# Patient Record
Sex: Male | Born: 1950 | Race: White | Hispanic: No | Marital: Married | Smoking: Current every day smoker
Health system: Southern US, Community
[De-identification: ages and names within clinical notes are randomized; demographics above are authoritative.]

## PROBLEM LIST (undated history)

## (undated) DIAGNOSIS — B1920 Unspecified viral hepatitis C without hepatic coma: Secondary | ICD-10-CM

## (undated) DIAGNOSIS — I10 Essential (primary) hypertension: Secondary | ICD-10-CM

## (undated) DIAGNOSIS — S0285XA Fracture of orbit, unspecified, initial encounter for closed fracture: Secondary | ICD-10-CM

## (undated) DIAGNOSIS — I639 Cerebral infarction, unspecified: Secondary | ICD-10-CM

## (undated) DIAGNOSIS — K635 Polyp of colon: Secondary | ICD-10-CM

## (undated) DIAGNOSIS — F419 Anxiety disorder, unspecified: Secondary | ICD-10-CM

## (undated) DIAGNOSIS — Z87828 Personal history of other (healed) physical injury and trauma: Secondary | ICD-10-CM

## (undated) DIAGNOSIS — K449 Diaphragmatic hernia without obstruction or gangrene: Secondary | ICD-10-CM

## (undated) DIAGNOSIS — H539 Unspecified visual disturbance: Secondary | ICD-10-CM

## (undated) DIAGNOSIS — R413 Other amnesia: Secondary | ICD-10-CM

## (undated) DIAGNOSIS — F329 Major depressive disorder, single episode, unspecified: Secondary | ICD-10-CM

## (undated) HISTORY — DX: Anxiety disorder, unspecified: F41.9

## (undated) HISTORY — DX: Unspecified viral hepatitis C without hepatic coma: B19.20

## (undated) HISTORY — DX: Personal history of other (healed) physical injury and trauma: Z87.828

## (undated) HISTORY — PX: COLONOSCOPY: SHX174

## (undated) HISTORY — DX: Essential (primary) hypertension: I10

## (undated) HISTORY — DX: Fracture of orbit, unspecified, initial encounter for closed fracture: S02.85XA

## (undated) HISTORY — DX: Diaphragmatic hernia without obstruction or gangrene: K44.9

## (undated) HISTORY — DX: Major depressive disorder, single episode, unspecified: F32.9

## (undated) HISTORY — PX: BACK SURGERY: SHX140

## (undated) HISTORY — PX: FACIAL COSMETIC SURGERY: SHX629

## (undated) HISTORY — DX: Unspecified visual disturbance: H53.9

## (undated) HISTORY — DX: Other amnesia: R41.3

## (undated) HISTORY — DX: Cerebral infarction, unspecified: I63.9

## (undated) HISTORY — DX: Polyp of colon: K63.5

## (undated) HISTORY — PX: TONSILLECTOMY: SHX5217

---

## 1998-09-23 ENCOUNTER — Encounter: Payer: Self-pay | Admitting: Gastroenterology

## 1998-09-23 ENCOUNTER — Ambulatory Visit (HOSPITAL_COMMUNITY): Admission: RE | Admit: 1998-09-23 | Discharge: 1998-09-23 | Payer: Self-pay | Admitting: Gastroenterology

## 1998-10-20 ENCOUNTER — Ambulatory Visit (HOSPITAL_COMMUNITY): Admission: RE | Admit: 1998-10-20 | Discharge: 1998-10-20 | Payer: Self-pay | Admitting: Gastroenterology

## 1998-10-20 ENCOUNTER — Encounter: Payer: Self-pay | Admitting: Gastroenterology

## 2005-11-28 ENCOUNTER — Inpatient Hospital Stay (HOSPITAL_COMMUNITY): Admission: RE | Admit: 2005-11-28 | Discharge: 2005-11-29 | Payer: Self-pay | Admitting: Orthopaedic Surgery

## 2008-01-21 ENCOUNTER — Ambulatory Visit (HOSPITAL_COMMUNITY): Admission: RE | Admit: 2008-01-21 | Discharge: 2008-01-21 | Payer: Self-pay | Admitting: Interventional Cardiology

## 2008-06-19 ENCOUNTER — Emergency Department (HOSPITAL_COMMUNITY): Admission: EM | Admit: 2008-06-19 | Discharge: 2008-06-19 | Payer: Self-pay | Admitting: Emergency Medicine

## 2010-02-02 IMAGING — CT CT HEART WO/W CTA ONLY W/ CA
1 of 3 series · 13 of 20 positions shown, 17 images · IV contrast (omnipaque)
Comparison: none

Addendum Begins

CARDIAC CTA WITH CALCIUM SCORE 01/21/2008 [DATE]
Ordering Physician: Lorraine Jim, M.D.
Reading Physician: [REDACTED] [email or web address].Edbert Miclat
Contrast: Omnipaque 350
Indications: 53 year old gentleman with recent chest pain who is
being evaluated to rule out coronary sinus of Valsalva aneurysm
Procedure:  Patient was pre-medicated with metoprolol 50 mg on the
evening prior and morning of this study.  A coronary calcium score
was performed.  Coronary CTA was performed using a gantry speed of
330 milliseconds at 0.6 mm collimation with 0.4 mm of overlap..
DETAILED FINDINGS:
Quality of Study: Excellent
Left Main: Normal
Left Anterior Descending: There is calcification in the proximal
vessel and midvessel.  Soft and hard plaque obstruct the vessel
less than 50% in the midsegment.  Two diagonal branches arise from
the LAD and are free of obstruction.
Left Circumflex: This vessel gives origin to one large obtuse
marginal branch.  Focal calcification is noted in the mid segment.
No significant obstruction is noted.
Right Coronary Artery: The right coronary also contains scattered
calcium deposits.  The mid vessel contains nonobstructive disease
there is less than 50% narrowed.  The distal vessel near a region
of calcification appears to contain borderline obstruction
Ventricular Function/Wall Motion: Normal
LV Ejection Fraction: 65%
Left Atrium, Right Atrium, RV Size: Moderate left atrial
enlargement measuring 45 mm
Pericardium: No effusion
Coronary Calcium Score: 204 Agatston units
Aorta: The aorta in the region of the cusps is dilated and measures
45 x 49 mm.  No sinus of Valsalva aneurysm can be identified.  The
ascending aorta is normal in diameter measuring 33 x 34 mm
Other:

[Series 11: 70% only · axial · 0.47mm/px · z∈[+1072,+1192]mm · 13 of 348 slices shown, 17 images]
[im 24/348  vessel]
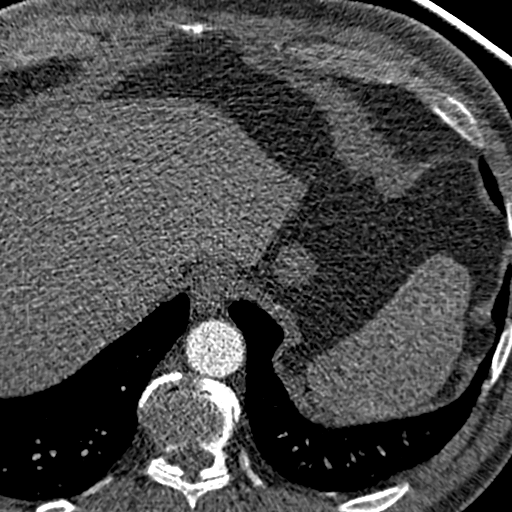
[im 24/348  lung]
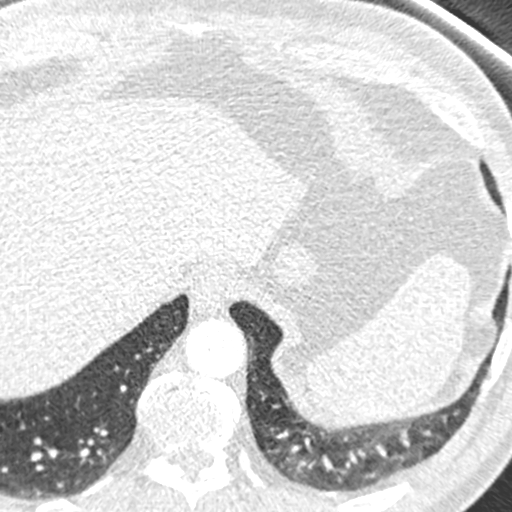
[im 47/348  vessel]
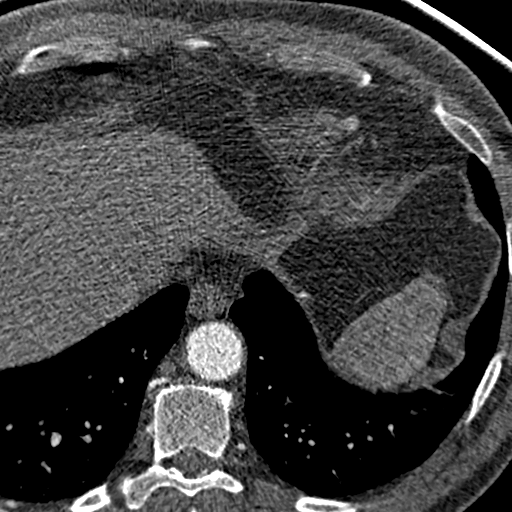
[im 70/348  vessel]
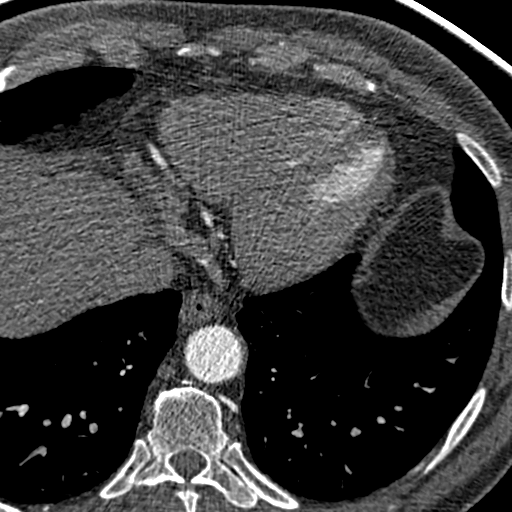
[im 93/348  vessel]
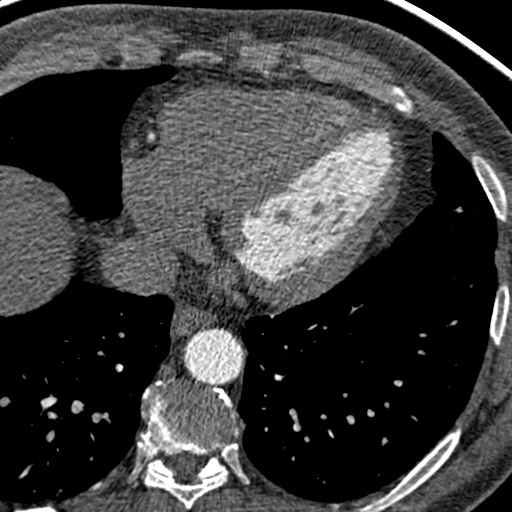
[im 116/348  vessel]
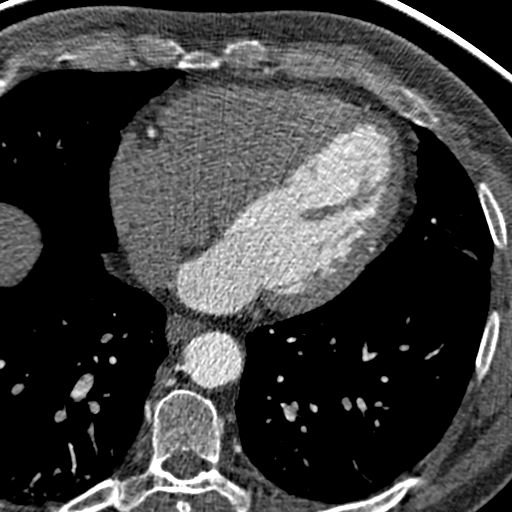
[im 116/348  lung]
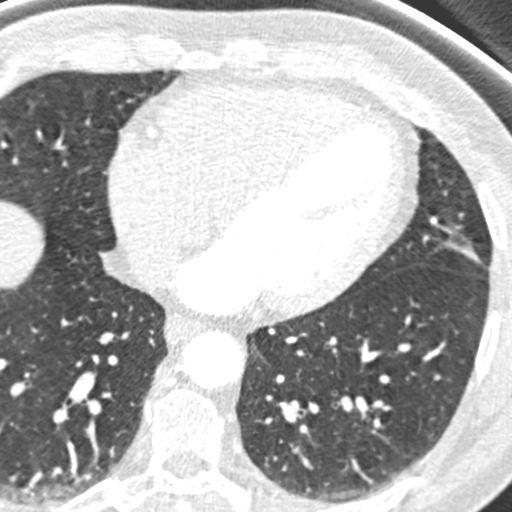
[im 139/348  vessel]
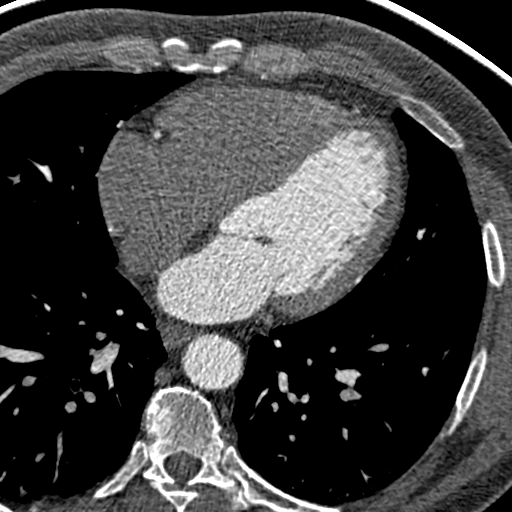
[im 186/348  vessel]
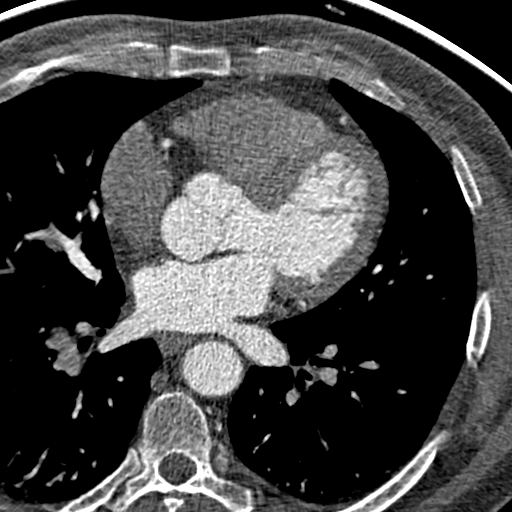
[im 209/348  vessel]
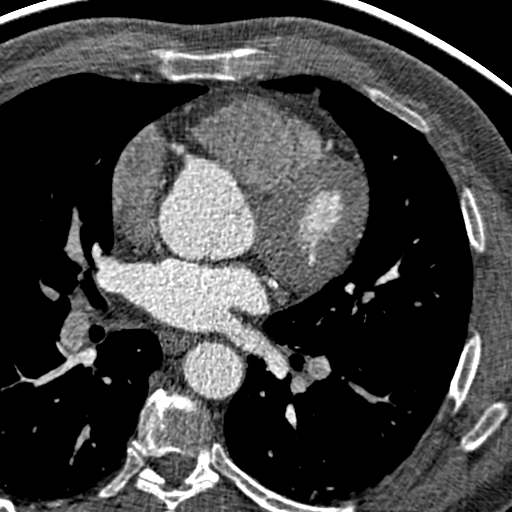
[im 232/348  vessel]
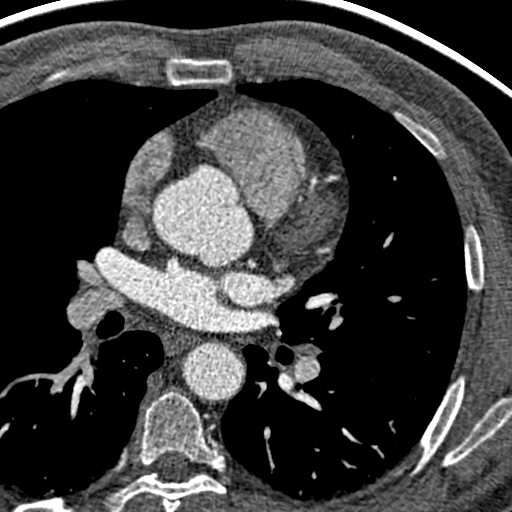
[im 232/348  lung]
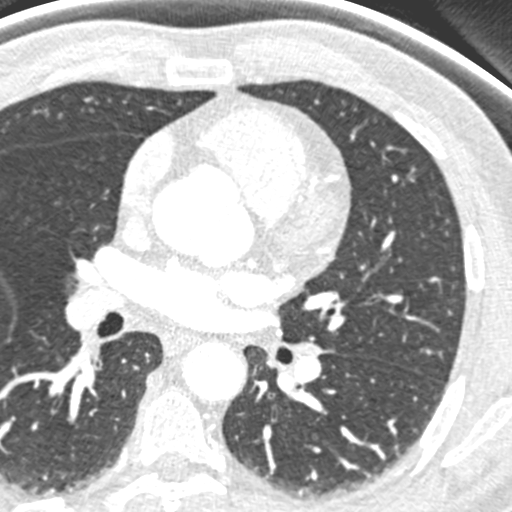
[im 255/348  vessel]
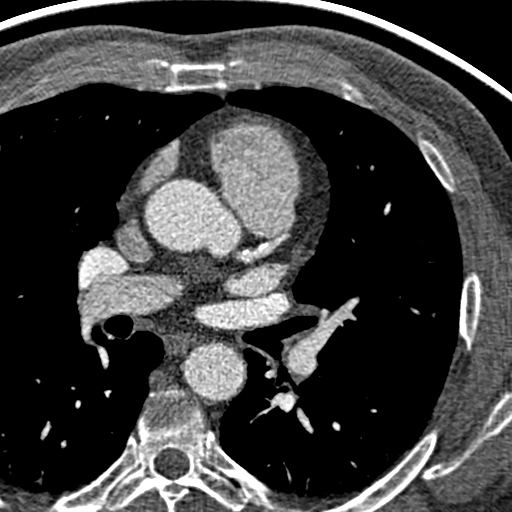
[im 278/348  vessel]
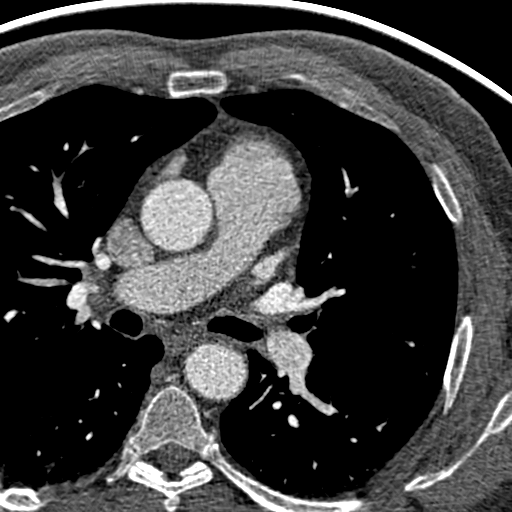
[im 301/348  vessel]
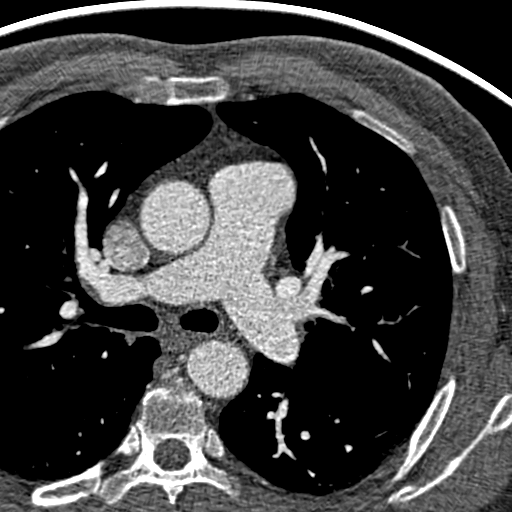
[im 324/348  vessel]
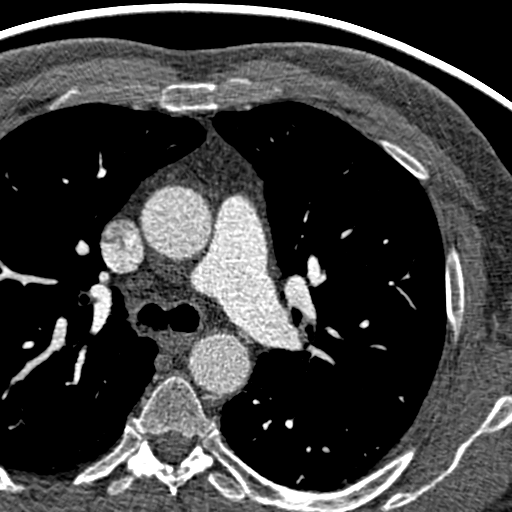
[im 324/348  lung]
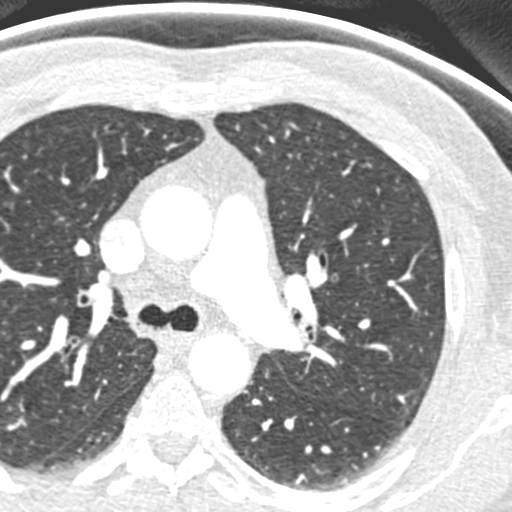

[13 of 20 positions shown; findings below may reference images not displayed]

IMPRESSION: 1.  The coronary artery calcium score is elevated at 204 Agatston
units.
2.  The left anterior descending, circumflex, and a right coronary
arteries contain calcification.  The distal right coronary contains
a moderate stenosis that could be significant.
3.  Normal left ventricle function.
4.  Mild left atrial enlargement.
5.  Dilated aortic root in the region of the sinuses of Valsalva
without discrete sinus aneurysm being noted.
6.  Consider performing a myocardial perfusion study to rule out
right coronary territory ischemia

Addendum Ends
OVER-READ INTERPRETATION - CT CHEST

The following report is an over-read performed by radiologist Dr.
Cela.[email or web address], M.D. of [REDACTED] , [place] on 01/21/2008
[DATE].  This over read does not include interpretation of
cardiac or coronary anatomy or pathology.  The CTA interpretation
by the Cardiologist is attached.
FINDINGS: Main pulmonary artery measures 3 cm.  Contrast mixing in
the lower lobe segmental pulmonary arteries makes evaluation for
filling defects difficult.  No pathologically enlarged lymph nodes
in the visualized mediastinum or hilar regions.

A 5 mm nodule in the left lower lobe superior segment is seen
(image 18).  There is dependent subpleural atelectasis bilaterally.
Scar in the lingula.  No pleural fluid.

Incidental imaging of the upper abdomen reveals 1.8 cm low
attenuation rounded structure in the left upper quadrant.

IMPRESSION

1.  Small left lower lobe nodule.  This can be followed in 12
months, as clinically indicated.
2.  Rounded low density structure in the left upper quadrant is of
uncertain etiology, as it is incompletely imaged.  Considerations
include an adrenal nodule or gastric diverticulum.

## 2010-12-22 NOTE — Op Note (Signed)
NAME:  George Sloan, George Sloan NO.:  [id number]   MEDICAL RECORD NO.:  [id number]          PATIENT TYPE:  INP   LOCATION:  2550                         FACILITY:  [facility]   PHYSICIAN:  Sharolyn Douglas, M.D.        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   PROCEDURE:  Revision L4-5 and L5-S1 lumbar laminotomy, facetectomy, lateral  recess decompression and diskectomy.   SURGEON:  Sharolyn Douglas, MD   ASSISTANT:  Verlin Fester, P.A.   ANESTHESIA:  General endotracheal.   ESTIMATED BLOOD LOSS:  Minimal.   COMPLICATIONS:  None.   INDICATIONS:  The patient is a 60 year old male with progressive back and  left lower extremity pain felt to be secondary to the lateral recess  narrowing and recurrent disk herniations on the left at L4-5 and L5-S1.  He  has failed to respond to appropriate conservative measures and now elects to  undergo revision decompression in hopes of improving his symptoms.  His  previous surgery was done the 1980s.   PROCEDURE:  He was identified in the holding area, taken to the operating  room, underwent general endotracheal anesthesia without difficulty, given  prophylactic IV antibiotics.  Carefully transferred onto the Wilson frame.  He was turned prone.  The back was prepped and draped in the usual sterile  fashion.  Face and eyes were protected all times.  The previous midline  incision was utilized.  Dissection was carried through the previous scar.  Subperiosteal exposure carried out on the left side exposing the previous  laminotomies at L4-L5 and L5-S1.  Intraoperative x-ray taken to confirm  levels.  The microscope was draped and brought into the field.  Starting at  L5-S1, the edges of the previous laminotomy were defined using curettes.  High-speed bur used to extend the  laminotomy proximally and also widened  towards the facet joint.  Kerrison punch used to remove the residual lamina  and undercut the facet joint.  We found epidural fibrosis and scarring.  We  identified the thecal sac as well as the S1 nerve root and we gently  mobilized this medially.  There was a disk protrusion which was partially  ossified displacing the S1 nerve roots.  The disc space was entered and the  disc material removed with a pituitary rongeur.  The lateral recess was  further decompressed out flush with the pedicle.  Hemostasis was achieved.   We then turned our attention to the L4-5 level.  Again, the edges of the  laminotomy at L4-5 for defined.  High-speed bur used to widen the  laminotomy.  Kerrison punches used to undercut the facet joint.  At this  level, the scarring was significant.  There was a large disc rupture which  was displacing the L5 nerve root dorsally which was immobile due to the scar  tissue.  Careful neurolysis was accomplished and the nerve was carefully  mobilized.  We then were able to decompress a large free fragment.  Immediately, the L5 nerve root and thecal sac became more mobile.  Further  mobilization allowed the disk space to be entered and decompressed.  Epstein  curettes were used to push the herniation back into the interspace.  The  nerve itself appeared to be swollen and red.  There was some scarring about  the takeoff of the nerve root which appeared to be chronic.  The wound was  irrigated.  Hemostasis was achieved.  We felt we had a good decompression of  the L5 nerve root and thecal sac.  Two mL of fentanyl left in the exposed  epidural space.  Gelfoam was left over the laminotomy defects.  Deep fascia  closed with a running #1 Vicryl suture.  Subcutaneous layer closed with 2-0  Vicryl suture followed by a running 3-0 subcuticular Vicryl suture on the  skin edges.  Benzoin and Steri-Strips placed.  Sterile dressing applied.  The  patient was turned supine, extubated without difficulty, transferred to  recovery in stable condition able to move his upper and lower extremities.  Needle and sponge count correct.      Sharolyn Douglas, M.D.  Electronically Signed     MC/MEDQ  D:  11/28/2005  T:  11/29/2005  Job:  [id number]

## 2011-05-09 LAB — RAPID URINE DRUG SCREEN, HOSP PERFORMED
Amphetamines: NOT DETECTED
Barbiturates: NOT DETECTED
Benzodiazepines: POSITIVE — AB
Cocaine: NOT DETECTED
Opiates: POSITIVE — AB
Tetrahydrocannabinol: POSITIVE — AB

## 2011-05-09 LAB — DIFFERENTIAL
Basophils Absolute: 0
Lymphocytes Relative: 13
Monocytes Absolute: 0.5
Neutro Abs: 11.2 — ABNORMAL HIGH
Neutrophils Relative %: 83 — ABNORMAL HIGH

## 2011-05-09 LAB — CBC
Hemoglobin: 16.1
Platelets: 202
RDW: 13.1

## 2011-05-09 LAB — POCT I-STAT, CHEM 8
Calcium, Ion: 1.15
HCT: 49
Hemoglobin: 16.7
Sodium: 139
TCO2: 26

## 2011-05-09 LAB — CK TOTAL AND CKMB (NOT AT ARMC)
CK, MB: 1.2
Relative Index: INVALID
Total CK: 33

## 2011-05-09 LAB — ETHANOL: Alcohol, Ethyl (B): <5

## 2011-10-04 DIAGNOSIS — R030 Elevated blood-pressure reading, without diagnosis of hypertension: Secondary | ICD-10-CM | POA: Diagnosis not present

## 2012-06-20 DIAGNOSIS — E782 Mixed hyperlipidemia: Secondary | ICD-10-CM | POA: Diagnosis not present

## 2012-06-20 DIAGNOSIS — M545 Low back pain, unspecified: Secondary | ICD-10-CM | POA: Diagnosis not present

## 2012-06-20 DIAGNOSIS — Z125 Encounter for screening for malignant neoplasm of prostate: Secondary | ICD-10-CM | POA: Diagnosis not present

## 2012-06-20 DIAGNOSIS — I10 Essential (primary) hypertension: Secondary | ICD-10-CM | POA: Diagnosis not present

## 2012-06-20 DIAGNOSIS — Z23 Encounter for immunization: Secondary | ICD-10-CM | POA: Diagnosis not present

## 2015-04-22 ENCOUNTER — Other Ambulatory Visit: Payer: Self-pay | Admitting: Gastroenterology

## 2015-04-22 DIAGNOSIS — K5730 Diverticulosis of large intestine without perforation or abscess without bleeding: Secondary | ICD-10-CM | POA: Diagnosis not present

## 2015-04-22 DIAGNOSIS — Z09 Encounter for follow-up examination after completed treatment for conditions other than malignant neoplasm: Secondary | ICD-10-CM | POA: Diagnosis not present

## 2015-04-22 DIAGNOSIS — Z86010 Personal history of colonic polyps: Secondary | ICD-10-CM | POA: Diagnosis not present

## 2015-04-22 DIAGNOSIS — K6389 Other specified diseases of intestine: Secondary | ICD-10-CM | POA: Diagnosis not present

## 2015-04-22 DIAGNOSIS — D122 Benign neoplasm of ascending colon: Secondary | ICD-10-CM | POA: Diagnosis not present

## 2015-04-28 DIAGNOSIS — S069X9A Unspecified intracranial injury with loss of consciousness of unspecified duration, initial encounter: Secondary | ICD-10-CM | POA: Diagnosis not present

## 2015-04-28 DIAGNOSIS — B1920 Unspecified viral hepatitis C without hepatic coma: Secondary | ICD-10-CM | POA: Diagnosis not present

## 2015-05-11 DIAGNOSIS — H524 Presbyopia: Secondary | ICD-10-CM | POA: Diagnosis not present

## 2015-05-11 DIAGNOSIS — H5203 Hypermetropia, bilateral: Secondary | ICD-10-CM | POA: Diagnosis not present

## 2015-05-11 DIAGNOSIS — H2513 Age-related nuclear cataract, bilateral: Secondary | ICD-10-CM | POA: Diagnosis not present

## 2015-05-11 DIAGNOSIS — H25012 Cortical age-related cataract, left eye: Secondary | ICD-10-CM | POA: Diagnosis not present

## 2015-05-16 ENCOUNTER — Ambulatory Visit: Payer: Medicare Other | Admitting: Neurology

## 2015-05-25 ENCOUNTER — Ambulatory Visit (INDEPENDENT_AMBULATORY_CARE_PROVIDER_SITE_OTHER): Payer: Medicare Other | Admitting: Neurology

## 2015-05-25 ENCOUNTER — Encounter: Payer: Self-pay | Admitting: Neurology

## 2015-05-25 VITALS — BP 145/96 | HR 71 | Ht 67.0 in | Wt 218.0 lb

## 2015-05-25 DIAGNOSIS — R51 Headache: Secondary | ICD-10-CM | POA: Diagnosis not present

## 2015-05-25 DIAGNOSIS — R519 Headache, unspecified: Secondary | ICD-10-CM

## 2015-05-25 DIAGNOSIS — R413 Other amnesia: Secondary | ICD-10-CM

## 2015-05-25 DIAGNOSIS — Z87820 Personal history of traumatic brain injury: Secondary | ICD-10-CM | POA: Diagnosis not present

## 2015-05-25 NOTE — Progress Notes (Signed)
PATIENT: George Sloan DOB: 01/28/1951  Chief Complaint  Patient presents with  . Head Injury    MMSE 26/30 - 9 animals. He had a head injury in the [employer], at age 64, where he fractured his right orbital bone.  Since his injury, he has had frequent headaches, memory difficulty and vision changes in his right eye.  Marland Kitchen Cerebrovascular Accident    He has history of CVA in 2009.       HISTORICAL  George Sloan is a 64 years old right-handed male, seen in refer by his primary care physician Dr. 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, patient reported he suffered right upper orbital rim fracture after a piece of metal snap ring popout out and slaped at his right forehead region, he did not recall loss of consciousness, came in with some supporting documentation for right obital rim fracture  He reported constant headache ever since the injury, severe headache for 6 months, with associated right forehead right facial numbness and pain, over the time, his headache fluctuated, but even now, he complains almost constant 5 out of 10 pressure headaches, with associated light noise sensitivity, difficulty with right eye movement, difficulty with peripheral vision  I also reviewed ophthalmology evaluation by Dr. Satira Sark dated May 13 2015, patient has mild ocular mobility limitations on far left, and the far right gaze, hyperopia, presbyopia,  He also complains of memory trouble, but was not able to elaborate on details, he used to work as a Furniture conservator/restorer, went on disability since age 64 due to his low back pain, he  dropped off from high school from tenth grade, was able to obtain GED after the [employer] service,  He also complains of chronic insomnia, anxiety,  REVIEW OF SYSTEMS: Full 14 system review of systems performed and notable only for blurred vision, double vision, eye pain, confusion, headaches, dizziness, insomnia, depression, anxiety, too much sleep, decreased energy, disinterested in activities.   ALLERGIES: No Known Allergies  HOME MEDICATIONS: Current Outpatient Prescriptions  Medication Sig Dispense Refill  . ALPRAZolam (XANAX) 1 MG tablet Take 1 mg by mouth. 2 tablets at bedtime and 1 tablet daily prn    . amitriptyline (ELAVIL) 50 MG tablet Take 50 mg by mouth at bedtime.    . Ascorbic Acid (VITAMIN C) 1000 MG tablet Take 1,000 mg by mouth daily.    Marland Kitchen buPROPion (ZYBAN) 150 MG 12 hr tablet Take 150 mg by mouth daily.    Marland Kitchen galantamine (RAZADYNE) 8 MG tablet Take 8 mg by mouth daily.    . hydrochlorothiazide (HYDRODIURIL) 25 MG tablet Take 25 mg by mouth daily.    Marland Kitchen omeprazole (PRILOSEC) 20 MG capsule Take 20 mg by mouth daily.    . propranolol (INDERAL) 40 MG tablet Take 40 mg by mouth 2 (two) times daily.    . Riboflavin (VITAMIN B-2 PO) Take by mouth daily.     No current facility-administered medications for this visit.    PAST MEDICAL HISTORY: Past Medical History  Diagnosis Date  . Hypertension   . Hepatitis C   . Stroke (  [place])     2009  . Anxiety and depression   . Hiatal hernia   . Colon polyps   . Hx of head injury     age 64  . Vision changes   . Memory loss   . Orbital fracture [facility])     age 64    PAST SURGICAL HISTORY: Past Surgical History  Procedure Laterality Date  . Facial cosmetic surgery      Related to orbital bone fracture  . Back surgery      x 2  . Colonoscopy      x 2  . Tonsillectomy      FAMILY HISTORY: Family History  Problem Relation Age of Onset  . Heart attack Father   . Hypertension Mother   . Stroke Mother     SOCIAL  HISTORY:  Social History   Social History  . Marital Status: Married    Spouse Name: N/A  . Number of Children: N/A  . Years of Education: GED   Occupational History  . Disabled    Social History Main Topics  . Smoking status: Current Every Day Smoker    Types: Cigarettes  . Smokeless tobacco: Not on file     Comment: one pack per week  . Alcohol Use: No  . Drug Use: No  . Sexual Activity: Not on file   Other Topics Concern  . Not on file   Social History Narrative   Lives at home with his wife.   Right-handed.   One cup coffee per day.     PHYSICAL EXAM   Filed Vitals:   05/25/15 0755  BP: 145/96  Pulse: 71  Height: 5\' 7"  (1.702 m)  Weight: 218 lb (98.884 kg)    Not recorded      Body mass index is 34.14 kg/(m^2).  PHYSICAL EXAMNIATION:  Gen: NAD, conversant, well nourised, obese, well groomed                     Cardiovascular: Regular rate rhythm, no peripheral edema, warm, nontender. Eyes: Conjunctivae clear without exudates or hemorrhage Neck: Supple, no carotid bruise. Pulmonary: Clear to auscultation bilaterally   NEUROLOGICAL EXAM:  MENTAL STATUS: Speech:    Speech is normal; fluent and spontaneous with normal comprehension.  Cognition: Mini-Mental Status Examination 20 out of 30, animal naming 9,       Orientation to time, place and person: he is not oriented to date, and time      recent and remote memory, Missed 2 out of 3 recalls     Normal Attention span and concentration     Normal Language, naming, repeating,spontaneous speech     Fund of knowledge   CRANIAL NERVES: CN II: Visual fields are full to confrontation. Fundoscopic exam is normal with sharp discs and no vascular changes. Pupils are round equal and briskly reactive to light. CN III, IV, VI: extraocular movement are normal. No ptosis. CN V: Facial sensation is intact to pinprick in all 3 divisions bilaterally. Corneal responses are intact.  CN VII: Face is symmetric with  normal eye closure and smile. CN VIII: Hearing is normal to rubbing fingers CN IX, X: Palate elevates symmetrically. Phonation is normal. CN XI: Head turning and shoulder shrug are intact CN XII: Tongue is midline with normal movements and no atrophy.  MOTOR: There is no pronator drift of out-stretched arms. Muscle bulk and tone are normal. Muscle strength is normal.  REFLEXES: Reflexes are 2+ and  symmetric at the biceps, triceps, knees, and ankles. Plantar responses are flexor.  SENSORY: Intact to light touch, pinprick, position sense, and vibration sense are intact in fingers and toes.  COORDINATION: Rapid alternating movements and fine finger movements are intact. There is no dysmetria on finger-to-nose and heel-knee-shin.    GAIT/STANCE: Posture is normal. Gait is steady with normal steps, base, arm swing, and turning. Heel and toe walking are normal. Tandem gait is normal.  Romberg is absent.   DIAGNOSTIC DATA (LABS, IMAGING, TESTING) - I reviewed patient records, labs, notes, testing and imaging myself where available.   ASSESSMENT AND PLAN  George Sloan is a 64 y.o. male   Reported history of right frontal area head trauma with right superior orbital rim fracture in 1973 Continued frequent headaches, memory loss  Complete evaluation with MRI brain,   Neuropsychiatric evaluations   [name], M.D. Ph.D.  [facility] [address], [place], [place] [address] Ph: [phone] Fax: [phone]  CC: Lona Kettle, MD

## 2015-06-11 ENCOUNTER — Ambulatory Visit
Admission: RE | Admit: 2015-06-11 | Discharge: 2015-06-11 | Disposition: A | Discharge status: Home / Self Care | Payer: Medicare Other | Source: Ambulatory Visit | Attending: Neurology | Admitting: Neurology

## 2015-06-11 DIAGNOSIS — R51 Headache: Secondary | ICD-10-CM | POA: Diagnosis not present

## 2015-06-11 DIAGNOSIS — R519 Headache, unspecified: Secondary | ICD-10-CM

## 2015-06-11 DIAGNOSIS — Z87820 Personal history of traumatic brain injury: Secondary | ICD-10-CM

## 2015-06-11 DIAGNOSIS — R413 Other amnesia: Secondary | ICD-10-CM

## 2015-06-13 ENCOUNTER — Telehealth: Payer: Self-pay | Admitting: Neurology

## 2015-06-13 ENCOUNTER — Encounter: Payer: Self-pay | Admitting: *Deleted

## 2015-06-13 NOTE — Telephone Encounter (Signed)
Spoke to patient - aware of results and will keep his follow up for MRI review.  He will start aspirin 81mg  daily.

## 2015-06-13 NOTE — Telephone Encounter (Signed)
Please call patient, MRI of the brain showed chronic left frontal stroke, mild atrophy, small vessel disease, I will review detail at his next follow-up visit in August 02 2015. He should take a baby aspirin daily  IMPRESSION: This MRI of the brain without contrast shows the following: 1. There are no acute findings on the current study. 2. There is a chronic left frontal wedge-shaped infarction with some hemosiderin deposition that appears essentially unchanged when compared to the 01/08/2008 MRI. 3. There has been progression of the chronic microvascular ischemic changes in the hemispheres, pons and cerebellum with compared to 01/08/2008. However, none of the foci appears to be acute. 4. There has been development of minimal cortical atrophy since 2009. 5. Some fluid noted in the left mastoid air cells is most consistent with mild eustachian tube dysfunction.

## 2015-08-02 ENCOUNTER — Ambulatory Visit (INDEPENDENT_AMBULATORY_CARE_PROVIDER_SITE_OTHER): Payer: Medicare Other | Admitting: Neurology

## 2015-08-02 ENCOUNTER — Encounter: Payer: Self-pay | Admitting: Neurology

## 2015-08-02 VITALS — BP 176/107 | HR 122 | Ht 67.0 in | Wt 217.0 lb

## 2015-08-02 DIAGNOSIS — S069X0S Unspecified intracranial injury without loss of consciousness, sequela: Secondary | ICD-10-CM

## 2015-08-02 DIAGNOSIS — G3184 Mild cognitive impairment, so stated: Secondary | ICD-10-CM

## 2015-08-02 DIAGNOSIS — S069XAA Unspecified intracranial injury with loss of consciousness status unknown, initial encounter: Secondary | ICD-10-CM | POA: Insufficient documentation

## 2015-08-02 DIAGNOSIS — S069X9A Unspecified intracranial injury with loss of consciousness of unspecified duration, initial encounter: Secondary | ICD-10-CM | POA: Insufficient documentation
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
## 2015-08-15 ENCOUNTER — Ambulatory Visit: Payer: Medicare Other | Admitting: Psychology

## 2015-08-18 ENCOUNTER — Ambulatory Visit: Payer: Medicare Other | Attending: Psychology | Admitting: Psychology

## 2015-08-18 DIAGNOSIS — R413 Other amnesia: Secondary | ICD-10-CM

## 2015-08-19 NOTE — Progress Notes (Addendum)
[facility]  [address]   Telephone [phone] [address] Fax [phone] [place], [place] [address]   [place]* This report should not be released without the consent of the client  Name:   George Sloan. Corbit   Date of Birth:  2051/02/04 [facility] MR#:  [id number] Date of Evaluation: 08/18/15  Reason for Referral George Sloan is a 65 year old right-handed man who sustained right frontal head trauma with a right superior orbital rim fracture in 1973. He has reported longtime headaches and memory difficulties. A MRI of the brain on 06/11/15 that was compared to a prior scan in 2009 showed a chronic, stable left frontal infarction, progression of non-acute widespread chronic microvascular ischemic changes and development of minimal cortical atrophy. He was referred for neuropsychological evaluation by George Pacas, MD of [facility] to assess his current cognitive functioning.   Sources of Information Electronic medical records from the [facility] and medical records provided by [name] were reviewed. Mr. Heward was interviewed.   Background & Informed Consent [name] stated that his reason for seeking evaluation at this time was to document that he sustained a traumatic brain injury (TBI) in 1973 and to identify "the residuals" of that injury so he could apply for an increase in [place] benefits.   He provided copies of handwritten pages that were stamped "Internal Medicine [facility] Jun 1973". To paraphrase, the records stated that he suffered a laceration over his right forehead with subsequent right facial numbness and ptosis. The rule-out diagnosis was "traumatic nerve injury". In a note dated February 27, 1972, it was stated that he had suffered a fracture of the right upper orbital region and that he complained of blurred vision. He also provided a copy of a psychiatric  progress note dated 03/14/15 written by George Sharp, MD of the [facility]. 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 It was noted that he has a history of traumatic brain injury, major depression and hepatitis C.  After reviewing medical records, he was informed that this clinician would not be able to confirm or refute his claim of having had a TBI in 1973. Moreover, he was informed that it might not be possible to state with a reasonable degree of certainty that any problems or deficits that might be identified by this assessment were directly related to that injury. He nonetheless agreed to continue with this evaluation.   Patient Report  George Sloan was not an adequate informant as he often stated he could not remember when certain life events occurred or gave vague answers or responded in a circumstantial or tangential manner.   With regards to his recollection of his injury in 1973, he reported that he was changing a tire when the rim popped off and struck him on the right side of his head. He reported memory of the impact. He stated his belief that he probably lost consciousness for several seconds. He recollected that within a few minutes of hours after this injury he experienced a tingling sensation at the back of his head on the right and right-sided headache. 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 He reported that he was honorably discharged about fourteen months after his injury. When asked about the persisting effects, if any, of that injury on his everyday functioning, he reported feeling self-consciousness  about the scarring on his right forehead.   When asked if that injury had any effects on his attention, memory or thinking, he stated that it did. The only memory complaint he reported was trouble remembering names of people.    He  described himself as being in good spirits. He did not report any current or ongoing life stressors. He stated that he is "not a people person" and that "people don't share my reality". He stated that he was diagnosed as "paranoid" by his psychiatrist. He denied experiencing persisting sad or low mood, mood instability, undue anxiety, suicidal or homicidal ideation and hallucinations.  Background George Sloan lives with his wife of twenty-two years. He has been married and divorced twice before. He has no children. He worked as a Furniture conservator/restorer for many years until going on disability at the age of 78 four due to low back pain. He reported that he left school in the ninth grade after "they turned me loose. because I was different from everybody". He stated his belief that he had problems with attention and learning in school; and that he received some type of special education services in grade school. 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 His past medical history was notable for hypertension, a stroke event in 2009 (he reported that he experienced left upper extremity weakness that lasted about one day), low back pain and Hepatitis C. He is a current cigarette smoker. He reported that for a period of time after he was discharged from the [employer], he drank excessive amounts of alcohol. He denied abuse of alcohol within the past twenty or thirty years. He denied use of illicit drugs. With regards to his mental health history, he is currently under treatment of a psychiatrist at the [facility]. He reported that he first saw a psychiatrist when he was on active duty as a young man for reasons he cannot remember. 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 To his knowledge he was never diagnosed with posttraumatic stress disorder. He could not provide any details of other possible mental health treatment. He reported no history of suicidal  behavior or mania. He reported no history of psychiatric hospitalization. His current medications include alprazolam, amitriptyline, aspirin, bupropion, galantamine, hydrochlorothiazide, omeprazole and propranolol.   Behavioral Observations He appeared as an appropriately dressed and groomed man in no apparent physical distress. He interacted in a mostly pleasant and cooperative manner. There were no signs of either psychomotor slowing or restlessness. He spoke in a normal tone of voice, maintained good eye contact and responded to all questions. He appeared alert and attentive though his thought processes seemed to be easily derailed on an internal basis. It was difficult to obtain details of his past life history as he often stated he could not remember, gave vague answers or responded with something tangential. There were no indications of loose associations, verbal perseverations or flight of ideas. There were no signs of confusion or loss of reality contact. His affect appeared within a wide range and was well-modulated. He did not show signs of emotional distress other than flashes of anger when he spoke of his belief that he has been mistreated by some medical professionals. His thought content was marked by cynicism and suspiciousness. For example, when asked to name this clinic he responded "it's a business. a business of making money". He stated his belief that his psychiatrist  has colluded with other medical professionals to block him from being classified as TBI. He repeatedly asked for reassurance that the report generated from this evaluation would not be released to anyone except the referring physician. He did not report having any bizarre ideas or hallucinations.   Evaluation Procedures The following tests or questionnaires were administered:  Brief Neuropsychological Cognitive Examination Brief Symptom Inventory Rey 15-Item Memory Test  Test of Memory Malingering  Wechsler Adult  Intelligence Scale-IV:  Digit Span, Matrix Reasoning & Vocabulary Wechsler Memory Scale-IV Flexible Battery & Symbol Span  Assessment Results Validity & Interpretative Considerations While he was grossly cooperative with testing procedures, he displayed signs of an argumentative and oppositional attitude. He made several negative statements about the testing process but would later apologize for "my misaligned aggression". An oppositional attitude was suggested by his propensity to give incomplete responses to factual questions. For example, he gave the name of this [place] state as "[place]" but after prompting said "[place]". In response to a question about [place] Presidents, he said "Reola Mosher" then after prompting said the complete name. He often talked about his life in the midst of performing a test and several times expressed personal associations to test items. For example, after a hearing story that contained the words [address] he began to talk about his experiences on being on a street by that name in his locality. Finally, he demonstrated apparent perseverative responses on tasks that required drawing from immediate memory or mental calculation.   The validity of test results was deemed doubtful given his inconsistent performance on formal measures of test-taking effort as well as atypical patterns of performance on tests of cognitive function. He failed one validity test (Rey 15-Item Memory Test) as he immediately drew only three of a possible fifteen symbols from memory despite the redundancy amongst the items that makes this a relatively easy task. In contrast, he scored within the normal range on a test that required immediate recognition of drawings of common objects from a set of two possibilities (Test of Memory Malingering) as he correctly recognized 47 out of 50 drawings on the second trial. He also displayed signs of inconsistent effort across test items. For example, he was  unable to draw a simple geometric design but then accurately drew two designs that were more complex. He was able to recall only one of three words after a brief interval though later was able to recall 95% of the details of stories read to him thirty minutes before.  His inconsistent effort was judged most likely due to his fluctuating attention and oppositional tendencies. An intentional attempt to feign cognitive impairment could not be ruled out but was deemed less likely.     Given indications of questionable validity, his low scores on neuropsychological tests could not be interpreted as evidence of brain dysfunction or considered representative of his actual level of cognitive functioning. On the other hand, since it is not possible to feign cognitive competency, scores within the normal range likely represented intact areas of ability.  Test Results The Brief Neuropsychological Cognitive Examination (BNCE) offers a standardized screening of major cognitive functions typically affected by neurological and psychiatric conditions. His BNCE total score of 19/30 fell within the moderate range of impairment. He demonstrated problems performing tasks that assessed orientation to time and place, simple drawing skill, simple or complex visual tracking, shifting of mental or motoric set, visual organization, short-delay word recall or abstract verbal reasoning.  His performances on subtests of  the Wechsler Adult Intelligence Scale-IV (WAIS-IV) that are considered to be highly correlated with general intellectual ability varied between the Low Average range on a measure of word knowledge (Wechsler Adult Intelligence Scale-IV (WAIS-IV) Vocabulary: 16th percentile) and the Borderline range on a test of nonverbal reasoning (WAIS-IV Matrix Reasoning: 5th percentile).   His passive span of auditory attention, as measured by his repetition of digit sequences (WAIS-IV Digit Span Forward: 25th percentile), was within  the Average range. His performances on tests that required him to hold and manipulate digit sequences in short-term auditory memory (Digit Span Backward: 25th percentile & Sequencing: 37th percentile) fell within the Average range.   His score on a visual working memory task that required immediate recall of series of symbols in right to left order (Wechsler Memory Scale-IV (WMS-IV) Symbol Span: 2nd percentile) fell within the impaired range.   A measure of his immediate memory for stories and figural designs (WMS-IV Flexible Approach Immediate Memory Index: 12th percentile) fell within the Low Average range. A measure of his delayed memory (WMS-IV Delayed Memory Index: 27th percentile ) fell within the Average range. His delayed recall was as expected for the figural designs but better than expected for stories read to him (i.e., 95% savings) given his initial level of encoding.  The Brief Symptom Inventory (BSI) is a 53-item self-report questionnaire listing symptoms of physical and psychological distress. Compared to a sample of non-patients, he reported an extremely high level of global psychological distress. Within the clinically significant range were BSI scales that tapped perceived cognitive dysfunction, paranoid thought processes and psychoticism (i.e., social alienation and thought control). Scales reflecting somatic complaints, interpersonal sensitivity/social self-consciousness, phobic anxiety or depression were within the above average range.  He endorsed a typical or average degree of generalized anxiety. He did not endorse any items reflective of angry feelings or aggressive behavior.   Summary & Conclusions Magic Kuhar is a 65 year old man with history of a head trauma in 1973. 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 He stated that his reason for seeking  neuropsychological  evaluation was to document that he sustained a traumatic brain injury in 1973 and to identify "the residuals" of that injury so he could apply for an increase in [place] benefits.   Observations were notable for his difficulty providing relevant history, tangential and circumstantial responses, sometimes argumentative though mostly pleasant attitude, and his cynical and suspicious thought content.   Neuropsychological  test results could not be validly interpreted due to indications of sub-optimal test-taking effort. His inconsistent effort was judged to more likely represent fluctuating attention and oppositional tendencies rather than an intentional attempt to feign cognitive impairment. Given questionable validity, his low scores on neuropsychological tests could not be interpreted as evidence of brain dysfunction nor considered representative of his actual level of cognitive functioning. On the other hand, since it is not possible to feign cognitive competency, his scores within normal expectations (i.e., Low Average or Average range) on measures of auditory working memory, fund of word knowledge, immediate memory and delayed memory likely represented intact areas of ability.  Assessment of his psychological functioning indicated that he endorsed a very high degree of cognitive dysfunction, paranoid thought processes and psychoticism (i.e., social alienation and thought control). He endorsed a typical or average degree of anxiety. He did not report angry feelings or behaviors.  In conclusion, whether he has a cognitive disorder cannot be  determined given his inconsistent effort and noncredible responses on neuropsychological testing. In any case, he presents with a number of conditions or factors that might cause or contribute to subnormal cognitive functioning, namely limited education with a possible history of attentional and learning difficulties, a closed head injury in 1973, a stroke  event or transient ischemic attack in 2009 and chronic psychiatric disorder. At this point, there is no clear way to untangle these factors.    I have appreciated the opportunity to evaluate Mr. Borza. Please feel free to contact me with any comments or questions.      _____________________ Jamey Ripa, Ph.D Licensed Psychologist

## 2015-10-04 ENCOUNTER — Encounter: Payer: Self-pay | Admitting: Neurology

## 2015-10-04 ENCOUNTER — Ambulatory Visit (INDEPENDENT_AMBULATORY_CARE_PROVIDER_SITE_OTHER): Payer: Medicare Other | Admitting: Neurology

## 2015-10-04 VITALS — BP 161/95 | HR 105 | Ht 67.0 in | Wt 219.0 lb

## 2015-10-04 DIAGNOSIS — S069X0S Unspecified intracranial injury without loss of consciousness, sequela: Secondary | ICD-10-CM

## 2015-10-04 DIAGNOSIS — G3184 Mild cognitive impairment, so stated: Secondary | ICD-10-CM | POA: Diagnosis not present
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
## 2015-10-05 DIAGNOSIS — I639 Cerebral infarction, unspecified: Secondary | ICD-10-CM | POA: Insufficient documentation

## 2016-10-31 DIAGNOSIS — S069X9A Unspecified intracranial injury with loss of consciousness of unspecified duration, initial encounter: Secondary | ICD-10-CM | POA: Diagnosis not present

## 2018-02-02 DIAGNOSIS — I469 Cardiac arrest, cause unspecified: Secondary | ICD-10-CM | POA: Diagnosis not present

## 2018-02-02 DIAGNOSIS — I499 Cardiac arrhythmia, unspecified: Secondary | ICD-10-CM | POA: Diagnosis not present

## 2018-02-03 DIAGNOSIS — 419620001 Death: Secondary | SNOMED CT | POA: Diagnosis not present

## 2018-02-03 DEATH — deceased

## 2018-03-06 DEATH — deceased

## 2023-10-10 ENCOUNTER — Other Ambulatory Visit (HOSPITAL_COMMUNITY): Payer: Self-pay | Admitting: Family Medicine

## 2023-10-10 ENCOUNTER — Ambulatory Visit (HOSPITAL_COMMUNITY)

## 2023-10-10 DIAGNOSIS — R911 Solitary pulmonary nodule: Secondary | ICD-10-CM
